# Patient Record
Sex: Male | Born: 1965 | Race: White | Hispanic: No | Marital: Married
Health system: Southern US, Community
[De-identification: ages and names within clinical notes are randomized; demographics above are authoritative.]

---

## 2008-01-29 ENCOUNTER — Ambulatory Visit (HOSPITAL_BASED_OUTPATIENT_CLINIC_OR_DEPARTMENT_OTHER): Admission: RE | Admit: 2008-01-29 | Discharge: 2008-01-29 | Payer: Self-pay | Admitting: Family Medicine

## 2008-02-16 ENCOUNTER — Encounter: Admission: RE | Admit: 2008-02-16 | Discharge: 2008-02-16 | Payer: Self-pay | Admitting: Orthopaedic Surgery

## 2011-06-15 ENCOUNTER — Other Ambulatory Visit: Payer: Self-pay | Admitting: Neurology

## 2011-06-15 DIAGNOSIS — H539 Unspecified visual disturbance: Secondary | ICD-10-CM

## 2011-07-06 ENCOUNTER — Ambulatory Visit
Admission: RE | Admit: 2011-07-06 | Discharge: 2011-07-06 | Disposition: A | Discharge status: Home / Self Care | Payer: BC Managed Care – PPO | Source: Ambulatory Visit | Attending: Neurology | Admitting: Neurology

## 2011-07-06 DIAGNOSIS — H539 Unspecified visual disturbance: Secondary | ICD-10-CM

## 2014-07-16 DIAGNOSIS — N50819 Testicular pain, unspecified: Secondary | ICD-10-CM | POA: Insufficient documentation

## 2014-08-20 ENCOUNTER — Other Ambulatory Visit: Payer: Self-pay | Admitting: Urology

## 2014-08-20 DIAGNOSIS — R1031 Right lower quadrant pain: Secondary | ICD-10-CM | POA: Insufficient documentation

## 2014-08-28 ENCOUNTER — Ambulatory Visit
Admission: RE | Admit: 2014-08-28 | Discharge: 2014-08-28 | Disposition: A | Discharge status: Home / Self Care | Payer: 59 | Source: Ambulatory Visit | Attending: Urology | Admitting: Urology

## 2014-08-28 DIAGNOSIS — R1031 Right lower quadrant pain: Secondary | ICD-10-CM

## 2014-08-28 MED ORDER — IOPAMIDOL (ISOVUE-300) INJECTION 61%
100.0000 mL | Freq: Once | INTRAVENOUS | Status: AC | PRN
  Administered 2014-08-28: 100 mL via INTRAVENOUS

## 2015-02-11 ENCOUNTER — Other Ambulatory Visit (HOSPITAL_BASED_OUTPATIENT_CLINIC_OR_DEPARTMENT_OTHER): Payer: Self-pay | Admitting: Family Medicine

## 2015-02-11 DIAGNOSIS — M5417 Radiculopathy, lumbosacral region: Secondary | ICD-10-CM

## 2015-02-17 ENCOUNTER — Ambulatory Visit (INDEPENDENT_AMBULATORY_CARE_PROVIDER_SITE_OTHER): Payer: 59

## 2015-02-17 DIAGNOSIS — M5417 Radiculopathy, lumbosacral region: Secondary | ICD-10-CM

## 2015-10-29 ENCOUNTER — Other Ambulatory Visit: Payer: Self-pay | Admitting: Family Medicine

## 2015-10-29 DIAGNOSIS — R911 Solitary pulmonary nodule: Secondary | ICD-10-CM

## 2015-11-11 ENCOUNTER — Ambulatory Visit
Admission: RE | Admit: 2015-11-11 | Discharge: 2015-11-11 | Disposition: A | Discharge status: Home / Self Care | Payer: 59 | Source: Ambulatory Visit | Attending: Family Medicine | Admitting: Family Medicine

## 2015-11-11 DIAGNOSIS — R911 Solitary pulmonary nodule: Secondary | ICD-10-CM

## 2015-11-11 MED ORDER — IOPAMIDOL (ISOVUE-300) INJECTION 61%
75.0000 mL | Freq: Once | INTRAVENOUS | Status: AC | PRN
Start: 2015-11-11 — End: 2015-11-11
  Administered 2015-11-11: 75 mL via INTRAVENOUS

## 2016-01-02 ENCOUNTER — Other Ambulatory Visit: Payer: Self-pay | Admitting: Sports Medicine

## 2016-01-02 DIAGNOSIS — M542 Cervicalgia: Secondary | ICD-10-CM

## 2016-01-10 ENCOUNTER — Ambulatory Visit
Admission: RE | Admit: 2016-01-10 | Discharge: 2016-01-10 | Disposition: A | Discharge status: Home / Self Care | Payer: 59 | Source: Ambulatory Visit | Attending: Sports Medicine | Admitting: Sports Medicine

## 2016-01-10 DIAGNOSIS — M542 Cervicalgia: Secondary | ICD-10-CM

## 2016-11-19 DIAGNOSIS — I10 Essential (primary) hypertension: Secondary | ICD-10-CM | POA: Diagnosis not present

## 2016-12-06 IMAGING — MR MR CERVICAL SPINE W/O CM
4 of 5 series · 26 of 48 positions shown · non-contrast
Comparison: None available.

CLINICAL DATA: Evaluation for neck pain radiating into left upper
extremity for several weeks. Also with associated tingling.

EXAM:
MRI CERVICAL SPINE WITHOUT CONTRAST
TECHNIQUE: Multiplanar, multisequence MR imaging of the cervical spine was
performed. No intravenous contrast was administered.

[Series 6: T1 · sagittal · 3.0mm · 0.66mm/px · 6 of 15 slices shown]
[im 1/15]
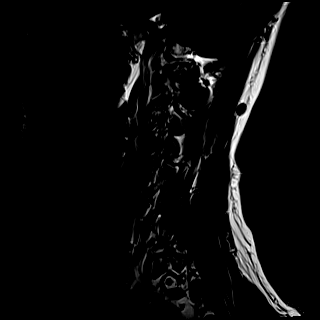
[im 3/15]
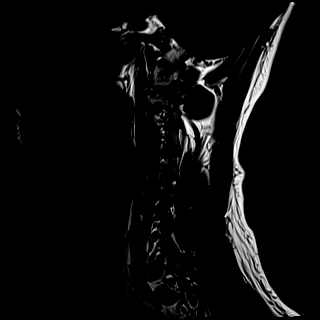
[im 6/15]
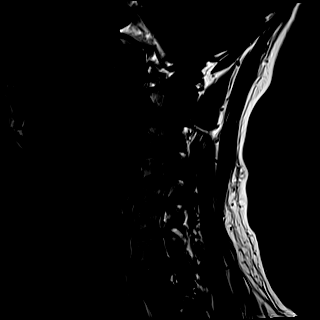
[im 9/15]
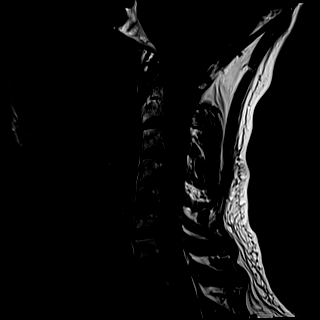
[im 12/15]
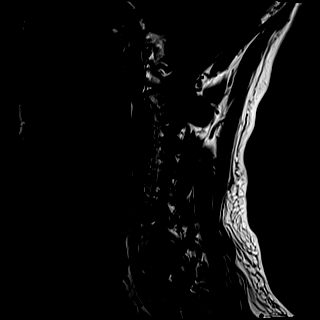
[im 15/15]
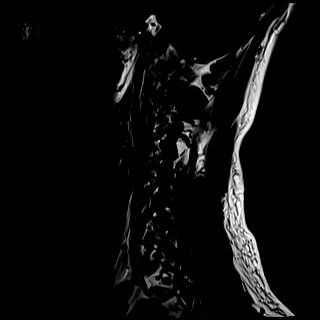

[Series 7: T2 · sagittal · 3.0mm · 0.55mm/px · 6 of 15 slices shown (1 of 2)]
[im 1/15]
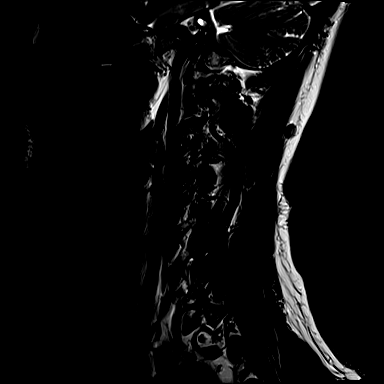
[im 3/15]
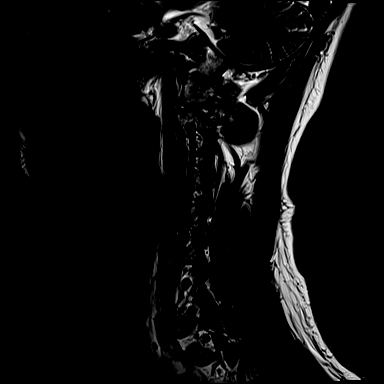
[im 6/15]
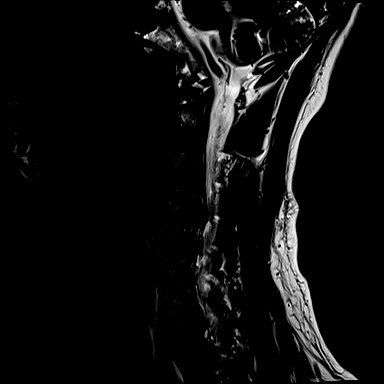
[im 9/15]
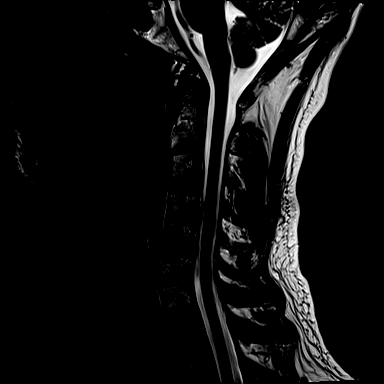
[im 12/15]
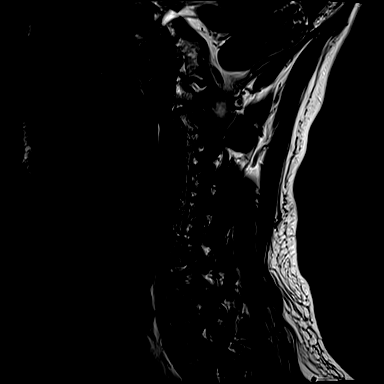
[im 15/15]
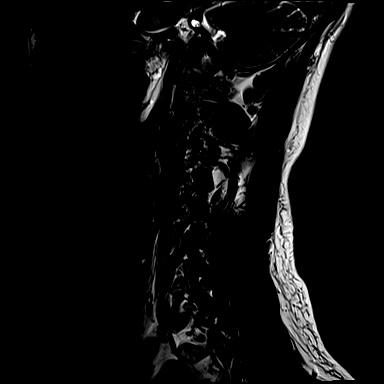

[Series 8: STIR · sagittal · 3.0mm · 0.33mm/px · 5 of 15 slices shown]
[im 1/15]
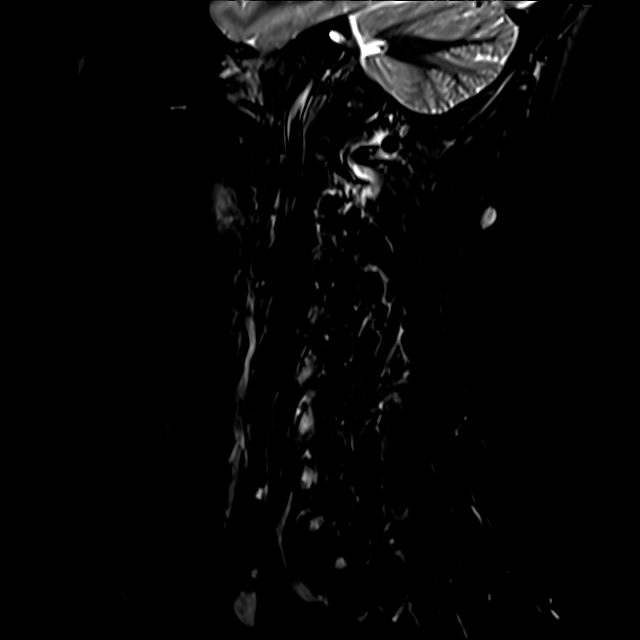
[im 3/15]
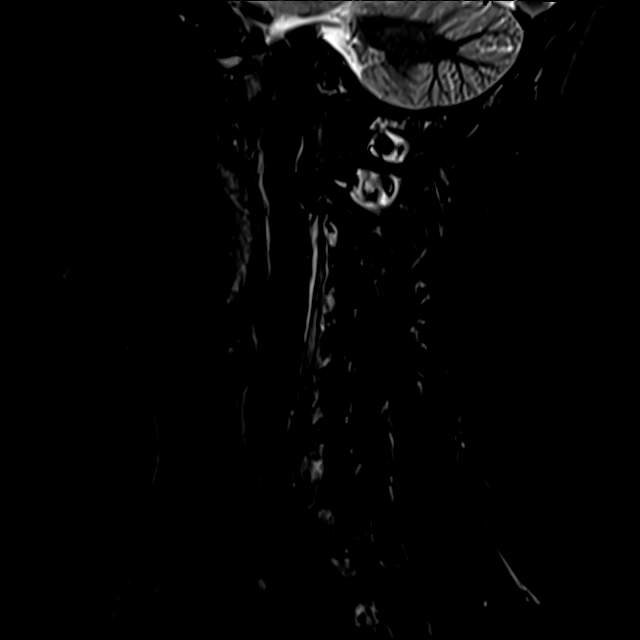
[im 6/15]
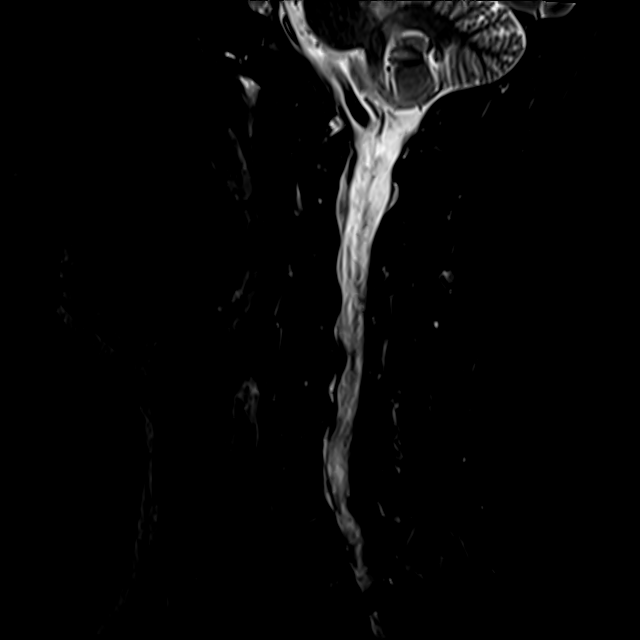
[im 9/15]
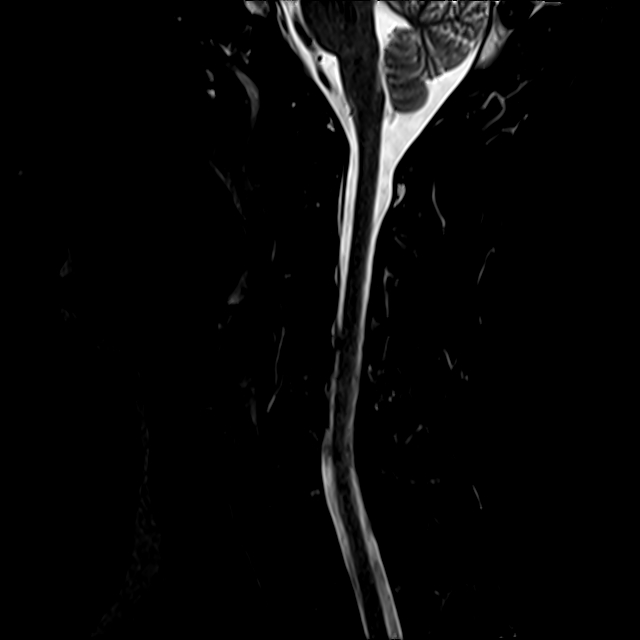
[im 15/15]
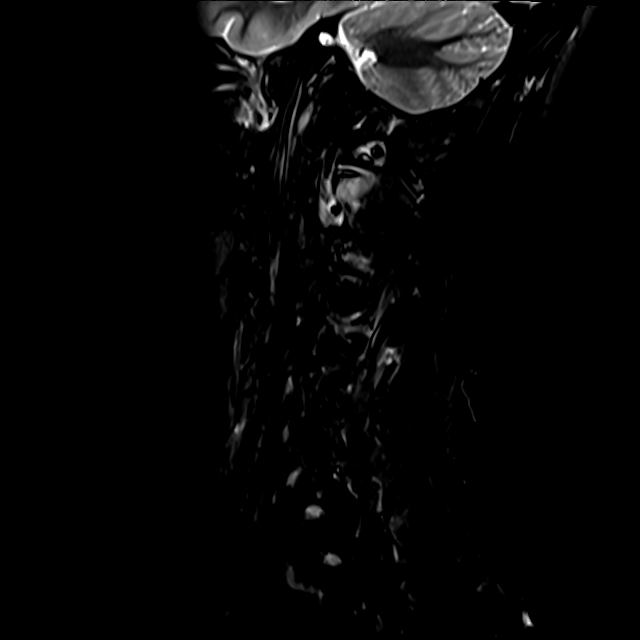

[Series 9: T2 · axial · 3.0mm · 0.50mm/px · z∈[-101,+9]mm · 9 of 35 slices shown (2 of 2)]
[im 1/35]
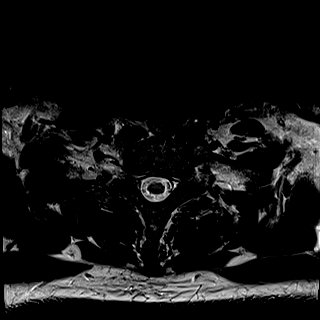
[im 5/35]
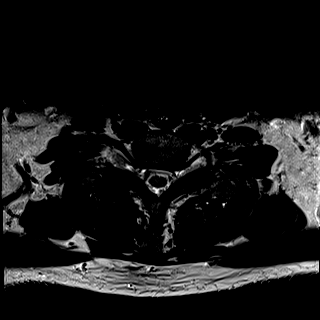
[im 10/35]
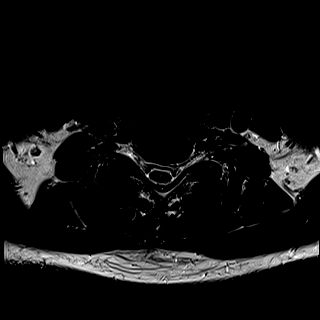
[im 15/35]
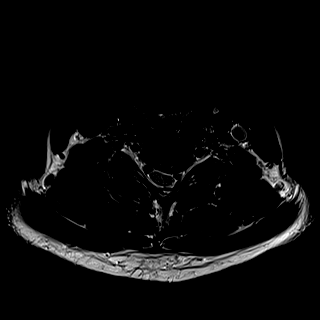
[im 18/35]
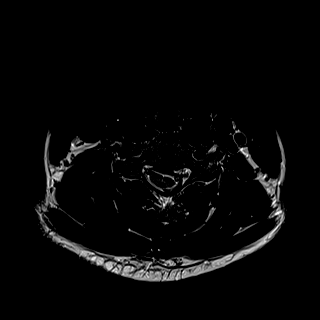
[im 20/35]
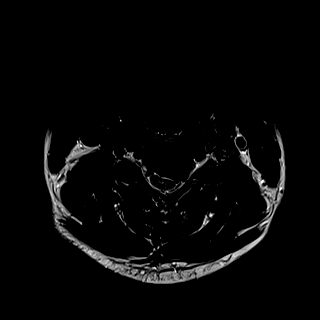
[im 25/35]
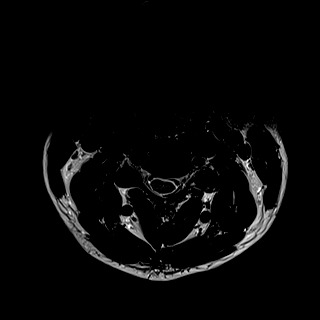
[im 30/35]
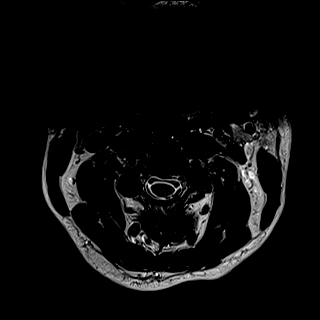
[im 35/35]
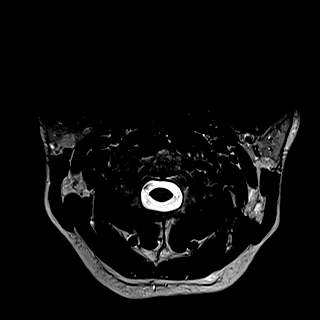

[26 of 48 positions shown; findings below may reference images not displayed]

FINDINGS: Alignment: Straightening with slight reversal of the normal cervical
lordosis, apex at C4-5. No listhesis.

Vertebrae: Vertebral body heights are well maintained. No evidence
for acute or chronic fracture. Signal intensity within the vertebral
body bone marrow is normal. No focal osseous lesions. No marrow
edema.

Cord: Signal intensity within the cervical spinal cord is normal.

Posterior Fossa, vertebral arteries, paraspinal tissues: Visualized
brain and posterior fossa are unremarkable. Craniocervical junction
normal. Paraspinous and prevertebral soft tissues within normal
limits. Normal intravascular flow voids are seen within the
vertebral arteries bilaterally.

Disc levels:

C2-C3: Bilateral uncovertebral hypertrophy, left slightly worse than
right. No significant disc bulge. Mild left foraminal narrowing.
Right neural foramen and central canal widely patent.

C3-C4: Bilateral uncovertebral hypertrophy, left slightly worse than
right. Mild facet degeneration. Resultant moderate left with mild
right foraminal narrowing. No significant canal narrowing.

C4-C5: Prominent left paracentral disc protrusion extending towards
the left neural foramen (series 9, image 16 on axial sequence,
series 7, image 10 on sagittal sequence). Associated caudad
migration of approximately 3 mm. Flattening of the left ventral
thecal sac as well as the left aspect of the cervical spinal cord.
No cord signal changes. Resultant severe left foraminal narrowing,
with the disc potentially impinging upon the left C5 nerve root.
Superimposed bilateral uncovertebral hypertrophy and facet disease.
Moderate right foraminal narrowing at this level as well.

C5-C6: Shallow broad-based disc osteophyte complex, eccentric to the
left. Complex partially effaces the ventral CSF, resulting in mild
canal stenosis. Mild flattening of the cervical spinal cord without
cord signal changes. Superimposed mild bilateral uncovertebral
hypertrophy with resultant moderate left and mild right foraminal
stenosis.

C6-C7:  Mild bilateral uncovertebral hypertrophy without stenosis.

C7-T1:  Negative.

Visualized upper thoracic spine without significant degenerative
changes or stenosis.
IMPRESSION: 1. Left paracentral disc protrusion at C4-5 with caudad migration,
resulting in severe left foraminal stenosis and potentially
impinging upon the left C5 nerve root.
2. Broad-based left paracentral disc osteophyte complex at C5-6 with
resultant mild canal and moderate left foraminal stenosis.
3. More mild degenerative spondylolysis at C2-3 and C3-4 with
resultant mild to moderate left foraminal narrowing.

## 2017-08-26 DIAGNOSIS — E785 Hyperlipidemia, unspecified: Secondary | ICD-10-CM | POA: Diagnosis not present

## 2017-08-26 DIAGNOSIS — Z125 Encounter for screening for malignant neoplasm of prostate: Secondary | ICD-10-CM | POA: Diagnosis not present

## 2017-08-26 DIAGNOSIS — I10 Essential (primary) hypertension: Secondary | ICD-10-CM | POA: Diagnosis not present

## 2018-02-27 DIAGNOSIS — I10 Essential (primary) hypertension: Secondary | ICD-10-CM | POA: Diagnosis not present

## 2018-02-27 DIAGNOSIS — E785 Hyperlipidemia, unspecified: Secondary | ICD-10-CM | POA: Diagnosis not present

## 2018-08-21 DIAGNOSIS — E781 Pure hyperglyceridemia: Secondary | ICD-10-CM | POA: Diagnosis not present

## 2018-08-21 DIAGNOSIS — I10 Essential (primary) hypertension: Secondary | ICD-10-CM | POA: Diagnosis not present

## 2019-03-14 ENCOUNTER — Other Ambulatory Visit: Payer: Self-pay

## 2019-03-14 DIAGNOSIS — Z20822 Contact with and (suspected) exposure to covid-19: Secondary | ICD-10-CM

## 2019-03-16 LAB — NOVEL CORONAVIRUS, NAA: SARS-CoV-2, NAA: NOT DETECTED

## 2019-04-30 ENCOUNTER — Ambulatory Visit: Payer: 59 | Attending: Internal Medicine

## 2019-04-30 DIAGNOSIS — U071 COVID-19: Secondary | ICD-10-CM

## 2019-04-30 DIAGNOSIS — R238 Other skin changes: Secondary | ICD-10-CM

## 2019-05-02 LAB — NOVEL CORONAVIRUS, NAA: SARS-CoV-2, NAA: NOT DETECTED

## 2020-03-24 ENCOUNTER — Ambulatory Visit: Payer: 59 | Attending: Internal Medicine

## 2020-03-24 DIAGNOSIS — Z23 Encounter for immunization: Secondary | ICD-10-CM

## 2020-03-24 NOTE — Progress Notes (Signed)
   [facility]  Name:  Howard Fuller    MRN: [id number] DOB: [birth year]  03/24/2020  [name] was observed post Covid-19 immunization for 15 minutes without incident. He was provided with Vaccine Information Sheet and instruction to access the V-Safe system.   [name] was instructed to call [phone] with any severe reactions post vaccine: Marland Kitchen Difficulty breathing  . Swelling of face and throat  . A fast heartbeat  . A bad rash all over body  . Dizziness and weakness   Immunizations Administered    Name Date Dose VIS Date Route   Moderna COVID-19 Vaccine 03/24/2020  3:50 PM 0.5 mL 02/20/2020 Intramuscular   Manufacturer: Gala Murdoch   Lot: [id number]   NDC: 97989-211-94

## 2022-12-30 ENCOUNTER — Ambulatory Visit
Admission: RE | Admit: 2022-12-30 | Discharge: 2022-12-30 | Disposition: A | Discharge status: Home / Self Care | Payer: 59 | Source: Ambulatory Visit | Attending: Orthopedic Surgery | Admitting: Orthopedic Surgery

## 2022-12-30 ENCOUNTER — Other Ambulatory Visit: Payer: Self-pay | Admitting: Orthopedic Surgery

## 2022-12-30 ENCOUNTER — Encounter: Payer: Self-pay | Admitting: Orthopedic Surgery

## 2022-12-30 DIAGNOSIS — M25531 Pain in right wrist: Secondary | ICD-10-CM

## 2023-04-06 ENCOUNTER — Other Ambulatory Visit: Payer: Self-pay | Admitting: Orthopedic Surgery

## 2023-04-06 DIAGNOSIS — M25531 Pain in right wrist: Secondary | ICD-10-CM

## 2023-04-18 ENCOUNTER — Ambulatory Visit
Admission: RE | Admit: 2023-04-18 | Discharge: 2023-04-18 | Disposition: A | Discharge status: Home / Self Care | Payer: 59 | Source: Ambulatory Visit | Attending: Orthopedic Surgery

## 2023-04-18 DIAGNOSIS — M25531 Pain in right wrist: Secondary | ICD-10-CM
